# Patient Record
Sex: Male | Born: 1965 | Race: White | Hispanic: No | Marital: Married | Smoking: Never smoker
Health system: Southern US, Community
[De-identification: ages and names within clinical notes are randomized; demographics above are authoritative.]

---

## 2016-12-11 DIAGNOSIS — L439 Lichen planus, unspecified: Secondary | ICD-10-CM | POA: Diagnosis not present

## 2016-12-17 DIAGNOSIS — H5213 Myopia, bilateral: Secondary | ICD-10-CM | POA: Diagnosis not present

## 2016-12-18 DIAGNOSIS — Z0100 Encounter for examination of eyes and vision without abnormal findings: Secondary | ICD-10-CM | POA: Diagnosis not present

## 2016-12-21 DIAGNOSIS — Z0100 Encounter for examination of eyes and vision without abnormal findings: Secondary | ICD-10-CM | POA: Diagnosis not present

## 2017-02-25 DIAGNOSIS — E871 Hypo-osmolality and hyponatremia: Secondary | ICD-10-CM | POA: Insufficient documentation

## 2017-02-25 DIAGNOSIS — I10 Essential (primary) hypertension: Secondary | ICD-10-CM | POA: Diagnosis present

## 2017-02-25 DIAGNOSIS — I160 Hypertensive urgency: Secondary | ICD-10-CM | POA: Insufficient documentation

## 2017-02-26 ENCOUNTER — Emergency Department (HOSPITAL_COMMUNITY)
Admission: EM | Admit: 2017-02-26 | Discharge: 2017-02-26 | Disposition: A | Discharge status: Home / Self Care | Payer: Commercial Managed Care - PPO | Attending: Emergency Medicine | Admitting: Emergency Medicine

## 2017-02-26 ENCOUNTER — Encounter (HOSPITAL_COMMUNITY): Payer: Self-pay

## 2017-02-26 ENCOUNTER — Emergency Department (HOSPITAL_COMMUNITY): Payer: Commercial Managed Care - PPO

## 2017-02-26 DIAGNOSIS — E871 Hypo-osmolality and hyponatremia: Secondary | ICD-10-CM | POA: Diagnosis not present

## 2017-02-26 DIAGNOSIS — I160 Hypertensive urgency: Secondary | ICD-10-CM

## 2017-02-26 DIAGNOSIS — R030 Elevated blood-pressure reading, without diagnosis of hypertension: Secondary | ICD-10-CM | POA: Diagnosis not present

## 2017-02-26 LAB — COMPREHENSIVE METABOLIC PANEL
ALBUMIN: 4.4 g/dL (ref 3.5–5.0)
ALT: 23 U/L (ref 17–63)
ANION GAP: 11 (ref 5–15)
AST: 25 U/L (ref 15–41)
Alkaline Phosphatase: 75 U/L (ref 38–126)
BILIRUBIN TOTAL: 0.6 mg/dL (ref 0.3–1.2)
BUN: 15 mg/dL (ref 6–20)
CHLORIDE: 93 mmol/L — AB (ref 101–111)
CO2: 24 mmol/L (ref 22–32)
Calcium: 9.6 mg/dL (ref 8.9–10.3)
Creatinine, Ser: 0.84 mg/dL (ref 0.61–1.24)
GFR calc Af Amer: >60 mL/min (ref >60)
GFR calc non Af Amer: >60 mL/min (ref >60)
GLUCOSE: 170 mg/dL — AB (ref 65–99)
Potassium: 4.3 mmol/L (ref 3.5–5.1)
SODIUM: 128 mmol/L — AB (ref 135–145)
TOTAL PROTEIN: 6.8 g/dL (ref 6.5–8.1)

## 2017-02-26 LAB — I-STAT CHEM 8, ED
BUN: 18 mg/dL (ref 6–20)
Calcium, Ion: 1.14 mmol/L — ABNORMAL LOW (ref 1.15–1.40)
Chloride: 92 mmol/L — ABNORMAL LOW (ref 101–111)
Creatinine, Ser: 0.7 mg/dL (ref 0.61–1.24)
Glucose, Bld: 164 mg/dL — ABNORMAL HIGH (ref 65–99)
HEMATOCRIT: 46 % (ref 39.0–52.0)
HEMOGLOBIN: 15.6 g/dL (ref 13.0–17.0)
POTASSIUM: 4.2 mmol/L (ref 3.5–5.1)
SODIUM: 129 mmol/L — AB (ref 135–145)
TCO2: 26 mmol/L (ref 0–100)

## 2017-02-26 LAB — I-STAT TROPONIN, ED: TROPONIN I, POC: 0.01 ng/mL (ref 0.00–0.08)

## 2017-02-26 LAB — DIFFERENTIAL
BASOS ABS: 0 10*3/uL (ref 0.0–0.1)
BASOS PCT: 1 %
EOS ABS: 0.2 10*3/uL (ref 0.0–0.7)
EOS PCT: 4 %
LYMPHS ABS: 2.1 10*3/uL (ref 0.7–4.0)
Lymphocytes Relative: 34 %
Monocytes Absolute: 0.6 10*3/uL (ref 0.1–1.0)
Monocytes Relative: 10 %
NEUTROS PCT: 53 %
Neutro Abs: 3.2 10*3/uL (ref 1.7–7.7)

## 2017-02-26 LAB — CBC
HCT: 41.5 % (ref 39.0–52.0)
Hemoglobin: 14.6 g/dL (ref 13.0–17.0)
MCH: 32.8 pg (ref 26.0–34.0)
MCHC: 35.2 g/dL (ref 30.0–36.0)
MCV: 93.3 fL (ref 78.0–100.0)
PLATELETS: 274 10*3/uL (ref 150–400)
RBC: 4.45 MIL/uL (ref 4.22–5.81)
RDW: 13.4 % (ref 11.5–15.5)
WBC: 6.1 10*3/uL (ref 4.0–10.5)

## 2017-02-26 LAB — PROTIME-INR
INR: 1
PROTHROMBIN TIME: 13.2 s (ref 11.4–15.2)

## 2017-02-26 LAB — APTT: APTT: 28 s (ref 24–36)

## 2017-02-26 NOTE — ED Provider Notes (Signed)
[facility]-EMER[facility]ENCY DEPT Provider Note   CSN: [id number] Arrival date & time: 02/25/17  2342     History   Chief Complaint Chief Complaint  Patient presents with  . Hypertension    HPI Cristian Mason is a [age] y.o. male.  HPI  51 year old male presents with hypertension. States that he has a history of hypertension and diabetes. He does not typically take his blood pressure at home. He states that tonight in this evening he started to not feel right. He couldn't describe exactly what the feeling was but he states he just didn't feel good. Later he checked his blood pressure and it was 190 and 180 systolic and consistently with 80s and 90s diastolic. Started googling blood pressure emergencies and became concerned about the possibility of a heart attack and stroke and so he came into the hospital. He states while he was at home he had a 1 or 2 minute episode of very localized numbness to his right thumb and first finger and thenar eminence. Denies any further numbness. He did not note any weakness. At some point he also had bilateral feet tingling but thinks this might of been from the way he was sitting. All of the symptoms have resolved. He never had a headache, chest pain, or shortness of breath.  History reviewed. No pertinent past medical history.  There are no active problems to display for this patient.   History reviewed. No pertinent surgical history.     Home Medications    Prior to Admission medications   Not on File    Family History History reviewed. No pertinent family history.  Social History Social History  Substance Use Topics  . Smoking status: Not on file  . Smokeless tobacco: Not on file  . Alcohol use Not on file     Allergies   Patient has no allergy information on record.   Review of Systems Review of Systems  Eyes: Negative for visual disturbance.  Respiratory: Negative for shortness of breath.   Cardiovascular: Negative for chest pain.    Gastrointestinal: Negative for abdominal pain and vomiting.  Musculoskeletal: Negative for neck pain.  Neurological: Positive for numbness. Negative for dizziness, weakness and headaches.  All other systems reviewed and are negative.    Physical Exam Updated Vital Signs BP (!) 182/100   Pulse 86   Temp 98.8 F (37.1 C) (Oral)   Resp 15   Ht 5\' 10"  (1.778 m)   Wt 90.7 kg (200 lb)   SpO2 100%   BMI 28.70 kg/m   Physical Exam  Constitutional: He is oriented to person, place, and time. He appears well-developed and well-nourished. No distress.  HENT:  Head: Normocephalic and atraumatic.  Right Ear: External ear normal.  Left Ear: External ear normal.  Nose: Nose normal.  Eyes: Right eye exhibits no discharge. Left eye exhibits no discharge.  Neck: Neck supple.  Cardiovascular: Normal rate, regular rhythm and normal heart sounds.   Pulmonary/Chest: Effort normal and breath sounds normal.  Abdominal: Soft. There is no tenderness.  Musculoskeletal: He exhibits no edema.  Neurological: He is alert and oriented to person, place, and time.  CN 3-12 grossly intact. 5/5 strength in all 4 extremities. Grossly normal sensation. Normal finger to nose.   Skin: Skin is warm and dry. He is not diaphoretic.  Nursing note and vitals reviewed.    ED Treatments / Results  Labs (all labs ordered are listed, but only abnormal results are displayed) Labs Reviewed  COMPREHENSIVE  METABOLIC PANEL - Abnormal; Notable for the following:       Result Value   Sodium 128 (*)    Chloride 93 (*)    Glucose, Bld 170 (*)    All other components within normal limits  I-STAT CHEM 8, ED - Abnormal; Notable for the following:    Sodium 129 (*)    Chloride 92 (*)    Glucose, Bld 164 (*)    Calcium, Ion 1.14 (*)    All other components within normal limits  PROTIME-INR  APTT  CBC  DIFFERENTIAL  I-STAT TROPOININ, ED  CBG MONITORING, ED    EKG  EKG Interpretation  Date/Time:  Tuesday February 26 2017 00:17:36 EDT Ventricular Rate:  83 PR Interval:  174 QRS Duration: 84 QT Interval:  360 QTC Calculation: 423 R Axis:   22 Text Interpretation:  Normal sinus rhythm Normal ECG No old tracing to compare Confirmed by Pricilla Loveless [id number]) on 02/26/2017 4:53:40 AM       Radiology Ct Head Wo Contrast  Result Date: 02/26/2017 CLINICAL DATA:  Elevated blood pressure numb feeling to the hand EXAM: CT HEAD WITHOUT CONTRAST TECHNIQUE: Contiguous axial images were obtained from the base of the skull through the vertex without intravenous contrast. COMPARISON:  None. FINDINGS: Brain: No evidence of acute infarction, hemorrhage, hydrocephalus, extra-axial collection or mass lesion/mass effect. Vascular: No hyperdense vessel or unexpected calcification. Skull: Normal. Negative for fracture or focal lesion. Sinuses/Orbits: No acute finding. Mild mucosal thickening in the ethmoid and maxillary sinuses. Small retention cyst in the left maxillary sinus. Non specific left orbital ridge sclerotic focus. Other: None IMPRESSION: No CT evidence for acute intracranial abnormality. Electronically Signed   By: Jasmine Pang M.D.   On: 02/26/2017 00:47    Procedures Procedures (including critical care time)  Medications Ordered in ED Medications - No data to display   Initial Impression / Assessment and Plan / ED Course  I have reviewed the triage vital signs and the nursing notes.  Pertinent labs & imaging results that were available during my care of the patient were reviewed by me and considered in my medical decision making (see chart for details).     Patient is well appearing and has been asymptomatic since arrival to ED. He is still hypertensive. Unclear what his baseline is as he does not check it often. Workup was started in triage with CT head and labs. However my concern for a stroke or TIA is very low. This was very localized numbness to the hand without weakness and bilateral feet tingling. I  think CVA/TIA unlikely. Discussed importance of PCP f/u, he will call in AM. Also discussed mild hyponatremia, may need to be changed off HCTZ. Discussed return precautions.  Final Clinical Impressions(s) / ED Diagnoses   Final diagnoses:  Hypertensive urgency  Hyponatremia    New Prescriptions New Prescriptions   No medications on file     Pricilla Loveless, MD 02/26/17 [phone]

## 2017-02-26 NOTE — ED Notes (Signed)
Pt departed in NAD, refused use of wheelchair.  

## 2017-02-26 NOTE — ED Notes (Signed)
ED Provider at bedside. 

## 2017-02-26 NOTE — ED Triage Notes (Signed)
Pt here for elevated bp, sts hx of htn and dm and sts takes meds but tonight bp was elevated. And reports numb feeling to hand but no weakness noted. Equal grip strengths

## 2017-04-09 DIAGNOSIS — E119 Type 2 diabetes mellitus without complications: Secondary | ICD-10-CM | POA: Diagnosis not present

## 2017-04-09 DIAGNOSIS — Z125 Encounter for screening for malignant neoplasm of prostate: Secondary | ICD-10-CM | POA: Diagnosis not present

## 2017-04-09 DIAGNOSIS — Z0000 Encounter for general adult medical examination without abnormal findings: Secondary | ICD-10-CM | POA: Diagnosis not present

## 2017-04-11 DIAGNOSIS — Z1212 Encounter for screening for malignant neoplasm of rectum: Secondary | ICD-10-CM | POA: Diagnosis not present

## 2017-04-11 DIAGNOSIS — Z0001 Encounter for general adult medical examination with abnormal findings: Secondary | ICD-10-CM | POA: Diagnosis not present

## 2017-04-11 DIAGNOSIS — Z23 Encounter for immunization: Secondary | ICD-10-CM | POA: Diagnosis not present

## 2017-04-11 DIAGNOSIS — I10 Essential (primary) hypertension: Secondary | ICD-10-CM | POA: Diagnosis not present

## 2017-04-25 DIAGNOSIS — Z1211 Encounter for screening for malignant neoplasm of colon: Secondary | ICD-10-CM | POA: Diagnosis not present

## 2017-04-25 DIAGNOSIS — Z1212 Encounter for screening for malignant neoplasm of rectum: Secondary | ICD-10-CM | POA: Diagnosis not present

## 2017-05-10 DIAGNOSIS — I10 Essential (primary) hypertension: Secondary | ICD-10-CM | POA: Diagnosis not present

## 2017-05-10 DIAGNOSIS — E119 Type 2 diabetes mellitus without complications: Secondary | ICD-10-CM | POA: Diagnosis not present

## 2017-06-10 DIAGNOSIS — N400 Enlarged prostate without lower urinary tract symptoms: Secondary | ICD-10-CM | POA: Diagnosis not present

## 2017-06-10 DIAGNOSIS — R972 Elevated prostate specific antigen [PSA]: Secondary | ICD-10-CM | POA: Diagnosis not present

## 2017-06-24 DIAGNOSIS — N400 Enlarged prostate without lower urinary tract symptoms: Secondary | ICD-10-CM | POA: Diagnosis not present

## 2017-08-22 DIAGNOSIS — E119 Type 2 diabetes mellitus without complications: Secondary | ICD-10-CM | POA: Diagnosis not present

## 2017-08-22 DIAGNOSIS — I10 Essential (primary) hypertension: Secondary | ICD-10-CM | POA: Diagnosis not present

## 2017-09-06 DIAGNOSIS — R972 Elevated prostate specific antigen [PSA]: Secondary | ICD-10-CM | POA: Diagnosis not present

## 2017-09-06 DIAGNOSIS — M25462 Effusion, left knee: Secondary | ICD-10-CM | POA: Diagnosis not present

## 2017-09-06 DIAGNOSIS — I10 Essential (primary) hypertension: Secondary | ICD-10-CM | POA: Diagnosis not present

## 2017-09-16 DIAGNOSIS — R972 Elevated prostate specific antigen [PSA]: Secondary | ICD-10-CM | POA: Diagnosis not present

## 2017-10-21 DIAGNOSIS — I10 Essential (primary) hypertension: Secondary | ICD-10-CM | POA: Diagnosis not present

## 2017-10-30 DIAGNOSIS — N411 Chronic prostatitis: Secondary | ICD-10-CM | POA: Diagnosis not present

## 2017-10-30 DIAGNOSIS — N4289 Other specified disorders of prostate: Secondary | ICD-10-CM | POA: Diagnosis not present

## 2017-10-30 DIAGNOSIS — R972 Elevated prostate specific antigen [PSA]: Secondary | ICD-10-CM | POA: Diagnosis not present

## 2017-12-20 DIAGNOSIS — E119 Type 2 diabetes mellitus without complications: Secondary | ICD-10-CM | POA: Diagnosis not present

## 2018-06-11 DIAGNOSIS — Z125 Encounter for screening for malignant neoplasm of prostate: Secondary | ICD-10-CM | POA: Diagnosis not present

## 2018-06-11 DIAGNOSIS — E119 Type 2 diabetes mellitus without complications: Secondary | ICD-10-CM | POA: Diagnosis not present

## 2018-06-11 DIAGNOSIS — Z0000 Encounter for general adult medical examination without abnormal findings: Secondary | ICD-10-CM | POA: Diagnosis not present

## 2018-06-18 DIAGNOSIS — E119 Type 2 diabetes mellitus without complications: Secondary | ICD-10-CM | POA: Diagnosis not present

## 2018-06-18 DIAGNOSIS — Z0000 Encounter for general adult medical examination without abnormal findings: Secondary | ICD-10-CM | POA: Diagnosis not present

## 2018-06-18 DIAGNOSIS — Z7982 Long term (current) use of aspirin: Secondary | ICD-10-CM | POA: Diagnosis not present

## 2018-06-20 ENCOUNTER — Other Ambulatory Visit: Payer: Self-pay | Admitting: Internal Medicine

## 2018-06-20 DIAGNOSIS — E119 Type 2 diabetes mellitus without complications: Secondary | ICD-10-CM

## 2018-06-20 DIAGNOSIS — I10 Essential (primary) hypertension: Secondary | ICD-10-CM

## 2019-06-23 ENCOUNTER — Other Ambulatory Visit: Payer: Self-pay

## 2019-06-23 DIAGNOSIS — Z20822 Contact with and (suspected) exposure to covid-19: Secondary | ICD-10-CM

## 2019-06-25 LAB — NOVEL CORONAVIRUS, NAA: SARS-CoV-2, NAA: NOT DETECTED

## 2019-07-21 ENCOUNTER — Other Ambulatory Visit: Payer: Self-pay

## 2019-07-21 DIAGNOSIS — Z20822 Contact with and (suspected) exposure to covid-19: Secondary | ICD-10-CM

## 2019-07-22 LAB — NOVEL CORONAVIRUS, NAA: SARS-CoV-2, NAA: DETECTED — AB

## 2019-10-20 ENCOUNTER — Ambulatory Visit (INDEPENDENT_AMBULATORY_CARE_PROVIDER_SITE_OTHER): Payer: 59

## 2019-10-20 ENCOUNTER — Other Ambulatory Visit: Payer: Self-pay

## 2019-10-20 ENCOUNTER — Ambulatory Visit: Payer: Commercial Managed Care - PPO | Admitting: Orthopaedic Surgery

## 2019-10-20 ENCOUNTER — Encounter: Payer: Self-pay | Admitting: Orthopaedic Surgery

## 2019-10-20 VITALS — Ht 70.0 in | Wt 190.0 lb

## 2019-10-20 DIAGNOSIS — M25511 Pain in right shoulder: Secondary | ICD-10-CM | POA: Diagnosis not present

## 2019-10-20 DIAGNOSIS — G8929 Other chronic pain: Secondary | ICD-10-CM

## 2019-10-20 NOTE — Progress Notes (Signed)
Office Visit Note   Patient: Cristian Mason           Date of Birth: [birth year]           MRN: [id number] Visit Date: 10/20/2019              Requested by: Merri Brunette, MD [address] [place],  [place] [address] PCP: Merri Brunette, MD   Assessment & Plan: Visit Diagnoses:  1. Chronic right shoulder pain     Plan: Tammy Sours has evidence of impingement involving the right shoulder.  He initially had onset of pain while getting off of a boat at the end of the summer.  He slipped and caught his right arm on a pole.  He had some black and blue discoloration about the elbow where it was "bruised" and slowly that problem resolved.  He was okay to return to playing sports related activities like tennis only to have recurrent pain in the area of his posterior right shoulder.  Now he is having trouble sleeping and reaching that creates pain along the anterior and posterior aspect of the shoulder.  He has a sensation of the shoulder catching and popping.  No numbness or tingling.  I think he could either have a small rotator cuff tear or a labral tear or even some pathology about the biceps tendon insertion.  He is having enough problem and not much relief with over-the-counter medicines I think it is worth obtaining an MR arthrogram  Follow-Up Instructions: Return After MRI arthrogram right shoulder.   Orders:  Orders Placed This Encounter  Procedures  . XR Shoulder Right  . MR SHOULDER RIGHT W CONTRAST   No orders of the defined types were placed in this encounter.     Procedures: No procedures performed   Clinical Data: No additional findings.   Subjective: Chief Complaint  Patient presents with  . Right Shoulder - Pain  Patient presents today for right shoulder pain. He fell getting off his boat at the end of last summer and caught himself on a pole. He is playing tennis and states that his shoulder has been hurting with playing. The pain after his injury  improved, but was initially more painful in his posterior shoulder. With playing tennis the pain is in the same area. No numbness, tingling, or weakness. He is right hand dominant. He takes Advil occasionally. He ices his shoulder after playing tennis. No previous shoulder surgery. He is diabetic.  He also states that it appears like his distal clavicle is sticking out more than it use to.   HPI  Review of Systems   Objective: Vital Signs: Ht 5\' 10"  (1.778 m)   Wt 190 lb (86.2 kg)   BMI 27.26 kg/m   Physical Exam Constitutional:      Appearance: He is well-developed.  Eyes:     Pupils: Pupils are equal, round, and reactive to light.  Pulmonary:     Effort: Pulmonary effort is normal.  Skin:    General: Skin is warm and dry.  Neurological:     Mental Status: He is alert and oriented to person, place, and time.  Psychiatric:        Behavior: Behavior normal.     Ortho Exam right shoulder with positive impingement and empty can testing.  Does have prominence of the distal clavicle with a superiorly projecting osteophyte but no pain.  Some pain along the anterior aspect of the subacromial region.  Good grip and  good release.  Does have good strength but does have some pain.  Some discomfort with the speed sign.  Specialty Comments:  No specialty comments available.  Imaging: XR Shoulder Right  Result Date: 10/20/2019 Films of the right shoulder obtained in several projections.  No acute changes.  Some degenerative change at the chromic clavicular joint with prominence of the distal clavicle superiorly.  Humeral head is centered about the glenoid.  Normal space between the humeral head and the acromion.  No evidence of glenohumeral arthritis    PMFS History: Patient Active Problem List   Diagnosis Date Noted  . Pain in right shoulder 10/20/2019   History reviewed. No pertinent past medical history.  History reviewed. No pertinent family history.  History reviewed. No  pertinent surgical history. Social History   Occupational History  . Not on file  Tobacco Use  . Smoking status: Never Smoker  . Smokeless tobacco: Never Used  Substance and Sexual Activity  . Alcohol use: Yes  . Drug use: Never  . Sexual activity: Not on file

## 2019-10-22 ENCOUNTER — Other Ambulatory Visit: Payer: Self-pay | Admitting: Orthopaedic Surgery

## 2019-10-22 DIAGNOSIS — G8929 Other chronic pain: Secondary | ICD-10-CM

## 2019-11-17 ENCOUNTER — Ambulatory Visit
Admission: RE | Admit: 2019-11-17 | Discharge: 2019-11-17 | Disposition: A | Discharge status: Home / Self Care | Payer: 59 | Source: Ambulatory Visit | Attending: Orthopaedic Surgery | Admitting: Orthopaedic Surgery

## 2019-11-17 DIAGNOSIS — G8929 Other chronic pain: Secondary | ICD-10-CM

## 2019-11-17 MED ORDER — IOPAMIDOL (ISOVUE-M 200) INJECTION 41%
12.0000 mL | Freq: Once | INTRAMUSCULAR | Status: AC
  Administered 2019-11-17: 12 mL via INTRA_ARTICULAR

## 2019-11-19 ENCOUNTER — Encounter: Payer: Self-pay | Admitting: Orthopaedic Surgery

## 2019-11-19 ENCOUNTER — Other Ambulatory Visit: Payer: Self-pay

## 2019-11-19 ENCOUNTER — Ambulatory Visit (INDEPENDENT_AMBULATORY_CARE_PROVIDER_SITE_OTHER): Payer: 59 | Admitting: Orthopaedic Surgery

## 2019-11-19 DIAGNOSIS — S43431A Superior glenoid labrum lesion of right shoulder, initial encounter: Secondary | ICD-10-CM

## 2019-11-19 DIAGNOSIS — M19011 Primary osteoarthritis, right shoulder: Secondary | ICD-10-CM | POA: Diagnosis not present

## 2019-11-19 MED ORDER — LIDOCAINE HCL 1 % IJ SOLN
1.0000 mL | INTRAMUSCULAR | Status: AC | PRN
  Administered 2019-11-19: 1 mL

## 2019-11-19 MED ORDER — METHYLPREDNISOLONE ACETATE 40 MG/ML IJ SUSP
30.0000 mg | INTRAMUSCULAR | Status: AC | PRN
  Administered 2019-11-19: 30 mg via INTRA_ARTICULAR

## 2019-11-19 MED ORDER — BUPIVACAINE HCL 0.25 % IJ SOLN
0.6600 mL | INTRAMUSCULAR | Status: AC | PRN
  Administered 2019-11-19: .66 mL via INTRA_ARTICULAR

## 2019-11-19 NOTE — Progress Notes (Signed)
Office Visit Note   Patient: Cristian Mason           Date of Birth: 02-Jan-1966           MRN: [id number] Visit Date: 11/19/2019              Requested by: Merri Brunette, MD [address] [place],  [place] [address] PCP: Merri Brunette, MD   Assessment & Plan: Visit Diagnoses:  1. Labral tear of shoulder, right, initial encounter   2. Primary osteoarthritis, right shoulder   3. Osteoarthritis of right AC (acromioclavicular) joint     Plan:  #1: Corticosteroid injection was placed intra-articularly from an anterior approach by Dr. Cleophas Dunker.  Tolerated the procedure well #2: We will see how this does and what type of effective will have. #3: Dr. Cleophas Dunker had a discussion with him also stating that he had spoken to Dr.Dean who felt that because a labral tear it may not be beneficial to repair it   Follow-Up Instructions: No follow-ups on file.   Face-to-face time spent with patient was greater than 30 minutes.  Greater than 50% of the time was spent in counseling and coordination of care.  Orders:  Orders Placed This Encounter  Procedures  . Large Joint Inj   No orders of the defined types were placed in this encounter.     Procedures: Large Joint Inj: R glenohumeral on 11/19/2019 2:59 PM Indications: pain and diagnostic evaluation Details: 25 G 1.5 in needle, anterior approach  Arthrogram: No  Medications: 1 mL lidocaine 1 %; 0.66 mL bupivacaine 0.25 %; 30 mg methylPREDNISolone acetate 40 MG/ML Outcome: tolerated well, no immediate complications  Injection performed by Dr Norlene Campbell Consent was given by the patient. Immediately prior to procedure a time out was called to verify the correct patient, procedure, equipment, support staff and site/side marked as required. Patient was prepped and draped in the usual sterile fashion.       Clinical Data: No additional findings.   Subjective: Chief Complaint  Patient presents with  . Right  Shoulder - Follow-up    MRI results  Patient presents today for follow up on his right shoulder. He had an MRI done on 11/17/2019 and is here today to go over those results.  HPI  Cristian Mason is seen today for review of his MRI scan which was previously performed.  He did have a problem with is right shoulder which developed after he was getting out of a boat at the end of summer when he slipped and caught his right arm on a pole.  He did have some ecchymosis about the elbow where it was bruised.  This has slowly resolved.  He returned to playing sports such as tennis only to have recurrent pain in the area of the posterior right shoulder.  He was seen previously and an MRI scan was ordered he returns today for review.   Review of Systems  Endocrine:       Diabetes   All other systems reviewed and are negative.    Objective: Vital Signs: Ht 5\' 10"  (1.778 m)   Wt 190 lb (86.2 kg)   BMI 27.26 kg/m   Physical Exam Constitutional:      Appearance: He is well-developed.  Eyes:     Pupils: Pupils are equal, round, and reactive to light.  Pulmonary:     Effort: Pulmonary effort is normal.  Skin:    General: Skin is warm and dry.  Neurological:  Mental Status: He is alert and oriented to person, place, and time.  Psychiatric:        Behavior: Behavior normal.     Ortho Exam  Exam is essentially unchanged from previous visit.  Specialty Comments:  No specialty comments available.  Imaging: MR SHOULDER RIGHT W CONTRAST  Result Date: 11/18/2019 CLINICAL DATA:  Right shoulder pain, popping, weakness and limited range of motion since a fall in June, 2020. EXAM: MR ARTHROGRAM OF THE RIGHT SHOULDER TECHNIQUE: Multiplanar, multisequence MR imaging of the right shoulder was performed following the administration of intra-articular contrast. CONTRAST:  See Injection Documentation. COMPARISON:  Plain films right shoulder from [facility] 10/20/2019. Image from contrast injection today.  FINDINGS: Rotator cuff: Intact. Mild appearing supraspinatus and infraspinatus tendinopathy noted. Muscles: Normal without atrophy or focal lesion. Biceps long head: Intact with normal signal. The biceps attachment to the superior labrum is normal. Acromioclavicular Joint: Moderate to moderately severe osteoarthritis is present. The acromion is type 1. There is no contrast or fluid in the subacromial/subdeltoid bursa. Glenohumeral Joint: The patient has glenohumeral degenerative change with cartilage thinning and an osteophyte off the humeral head. Small subchondral cysts are seen in the posterior, inferior glenoid. Labrum: There is a near circumferential tear of the labrum. Only the anterior, superior labrum appears intact. Bones: No fracture or worrisome lesion. IMPRESSION: Dominant finding is glenohumeral osteoarthritis with an associated near circumferential tear of the labrum. Mild appearing supraspinatus and infraspinatus tendinopathy without tear. Moderate to moderately severe acromioclavicular osteoarthritis. Electronically Signed   By: Inge Rise M.D.   On: 11/18/2019 09:17     PMFS History: Current Outpatient Medications  Medication Sig Dispense Refill  . amLODipine (NORVASC) 10 MG tablet TK 1 T PO QD    . aspirin EC 81 MG tablet Take 81 mg by mouth daily.    Marland Kitchen BYSTOLIC 20 MG TABS Take 1 tablet by mouth daily.    Marland Kitchen GLYXAMBI 25-5 MG TABS TK 1 T PO QD    . loratadine (CLARITIN) 10 MG tablet Take 10 mg by mouth daily.    . metFORMIN (GLUCOPHAGE-XR) 750 MG 24 hr tablet TK 2 TS PO QD WITH THE EVE MEAL    . MULTIPLE VITAMIN PO Take by mouth.    . TRESIBA FLEXTOUCH 100 UNIT/ML SOPN FlexTouch Pen INJECT 16 UNITS SUBCUTANEOUSLY ONCE A DAY     No current facility-administered medications for this visit.    Patient Active Problem List   Diagnosis Date Noted  . Labral tear of shoulder, right, initial encounter 11/19/2019  . Primary osteoarthritis, right shoulder 11/19/2019  .  Osteoarthritis of right AC (acromioclavicular) joint 11/19/2019  . Pain in right shoulder 10/20/2019   History reviewed. No pertinent past medical history.  History reviewed. No pertinent family history.  History reviewed. No pertinent surgical history. Social History   Occupational History  . Not on file  Tobacco Use  . Smoking status: Never Smoker  . Smokeless tobacco: Never Used  Substance and Sexual Activity  . Alcohol use: Yes  . Drug use: Never  . Sexual activity: Not on file

## 2019-11-20 ENCOUNTER — Ambulatory Visit: Payer: 59

## 2019-11-26 ENCOUNTER — Ambulatory Visit: Payer: 59 | Attending: Internal Medicine

## 2019-11-26 DIAGNOSIS — Z23 Encounter for immunization: Secondary | ICD-10-CM

## 2019-11-26 NOTE — Progress Notes (Signed)
   Covid-19 Vaccination Clinic  Name:  KAELUM KISSICK    MRN: [id number] DOB: 02-26-1966  11/26/2019  Mr. Vert was observed post Covid-19 immunization for 15 minutes without incident. He was provided with Vaccine Information Sheet and instruction to access the V-Safe system.   Mr. Slovacek was instructed to call 911 with any severe reactions post vaccine: Marland Kitchen Difficulty breathing  . Swelling of face and throat  . A fast heartbeat  . A bad rash all over body  . Dizziness and weakness   Immunizations Administered    Name Date Dose VIS Date Route   Pfizer COVID-19 Vaccine 11/26/2019 10:21 AM 0.3 mL 08/28/2019 Intramuscular   Manufacturer: [employer], [employer]   Lot: [id number]   NDC: [address]-4

## 2019-12-21 ENCOUNTER — Ambulatory Visit: Payer: 59 | Attending: Internal Medicine

## 2019-12-21 DIAGNOSIS — Z23 Encounter for immunization: Secondary | ICD-10-CM

## 2019-12-21 NOTE — Progress Notes (Signed)
   Covid-19 Vaccination Clinic  Name:  AARIN BLUETT    MRN: [id number] DOB: 1966/04/20  12/21/2019  Mr. Mose was observed post Covid-19 immunization for 15 minutes without incident. He was provided with Vaccine Information Sheet and instruction to access the V-Safe system.   Mr. Biello was instructed to call 911 with any severe reactions post vaccine: Marland Kitchen Difficulty breathing  . Swelling of face and throat  . A fast heartbeat  . A bad rash all over body  . Dizziness and weakness   Immunizations Administered    Name Date Dose VIS Date Route   Pfizer COVID-19 Vaccine 12/21/2019 10:20 AM 0.3 mL 08/28/2019 Intramuscular   Manufacturer: [employer], [employer]   Lot: [id number]   NDC: [address]-9

## 2020-06-29 IMAGING — MR MR SHOULDER*R* W/CM
7 series · 40 of 40 positions shown · IV contrast (agent unspecified)
Comparison: Plain films right shoulder from [REDACTED]
10/20/2019. Image from contrast injection today.

CLINICAL DATA: Right shoulder pain, popping, weakness and limited
range of motion since a fall in February 2019.

EXAM:
MR ARTHROGRAM OF THE RIGHT SHOULDER
TECHNIQUE: Multiplanar, multisequence MR imaging of the right shoulder was
performed following the administration of intra-articular contrast.
CONTRAST:  See Injection Documentation.

[Series 3: T1 fat-sat · axial · 4.0mm · 0.27mm/px · z∈[-45,+70]mm · 6 of 24 slices shown (1 of 4)]
[im 1/24]
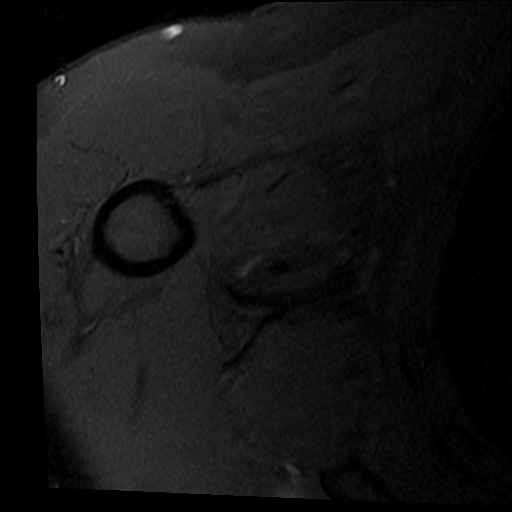
[im 5/24]
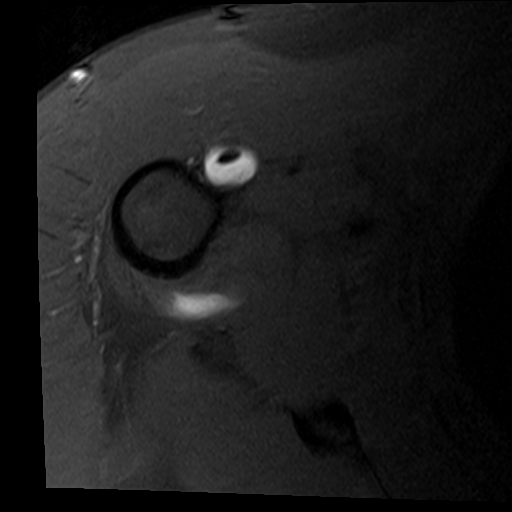
[im 10/24]
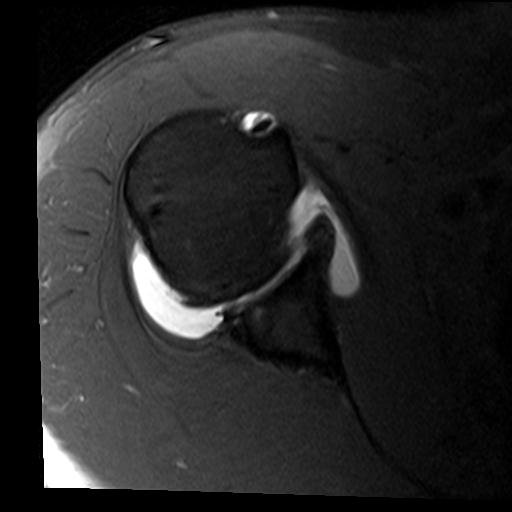
[im 14/24]
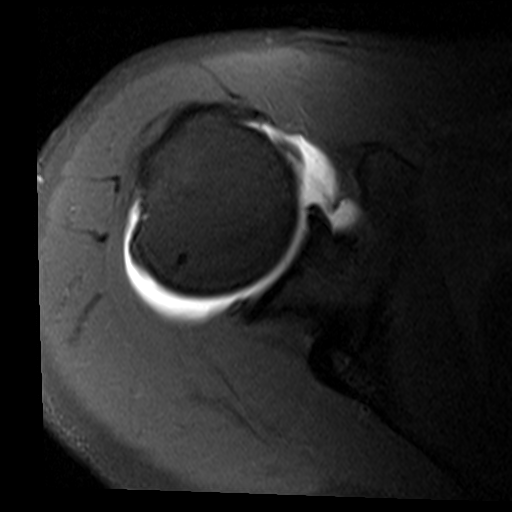
[im 19/24]
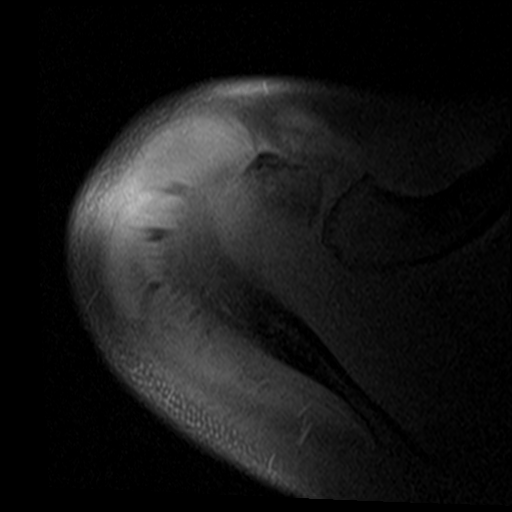
[im 24/24]
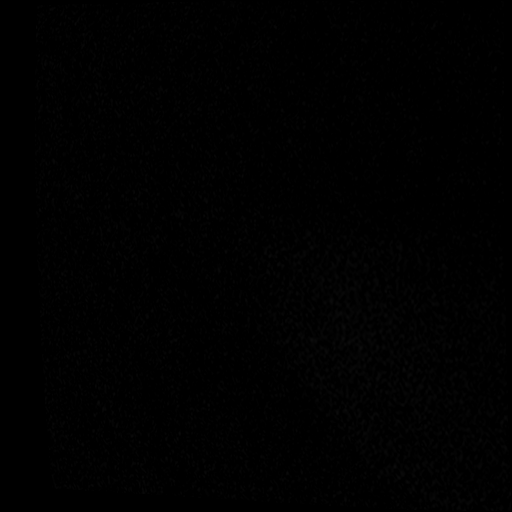

[Series 4: T2 fat-sat · coronal · 4.0mm · 0.55mm/px · 6 of 23 slices shown (1 of 3)]
[im 1/23]
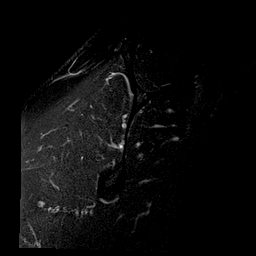
[im 5/23]
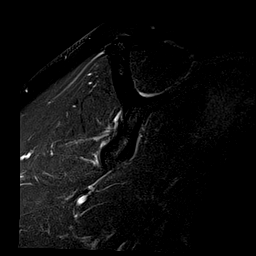
[im 9/23]
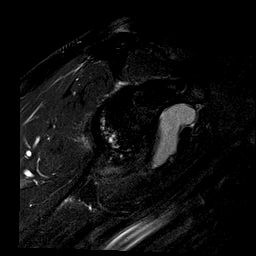
[im 14/23]
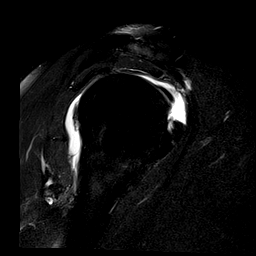
[im 18/23]
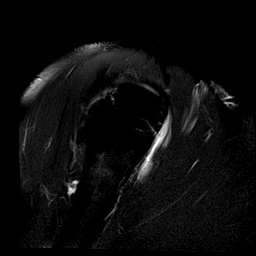
[im 23/23]
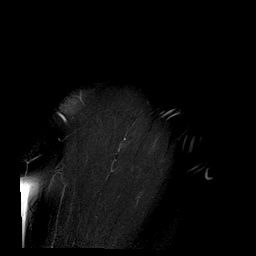

[Series 6: T2 fat-sat · coronal · 4.0mm · 0.55mm/px · 6 of 23 slices shown (2 of 3)]
[im 1/23]
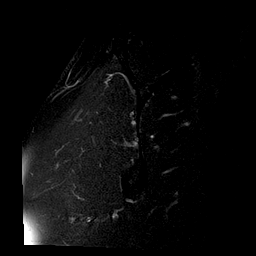
[im 5/23]
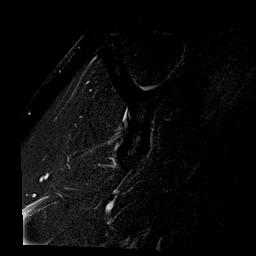
[im 9/23]
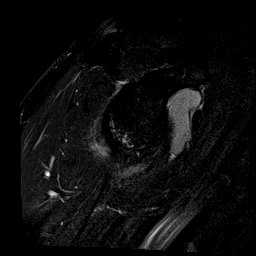
[im 14/23]
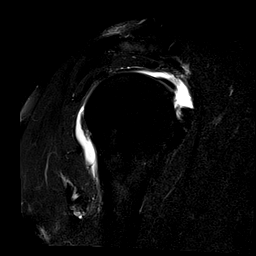
[im 18/23]
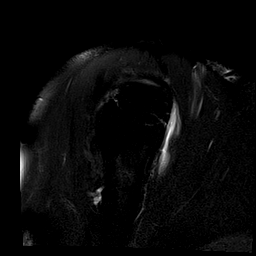
[im 23/23]
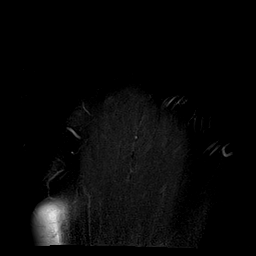

[Series 7: T1 fat-sat · sagittal · 4.0mm · 0.55mm/px · 6 of 22 slices shown (2 of 4)]
[im 1/22]
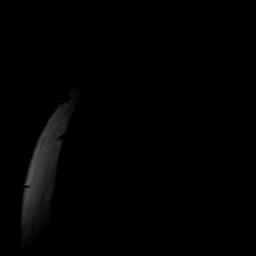
[im 5/22]
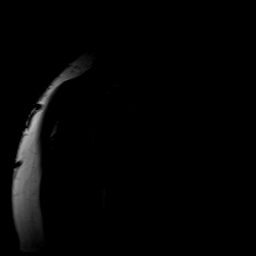
[im 9/22]
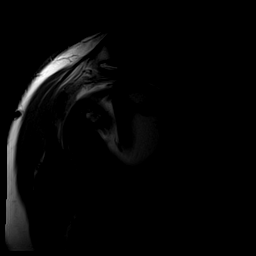
[im 13/22]
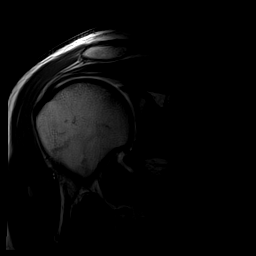
[im 17/22]
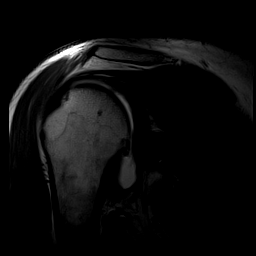
[im 22/22]
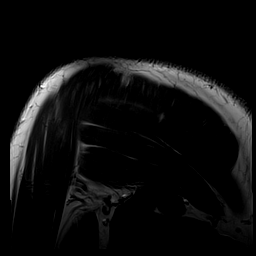

[Series 8: T1 fat-sat · sagittal · 4.0mm · 0.55mm/px · 6 of 22 slices shown (3 of 4)]
[im 1/22]
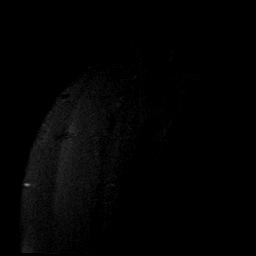
[im 5/22]
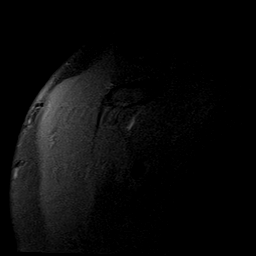
[im 9/22]
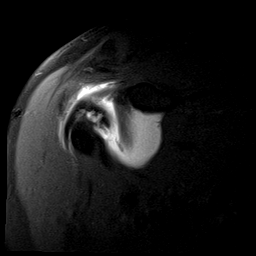
[im 13/22]
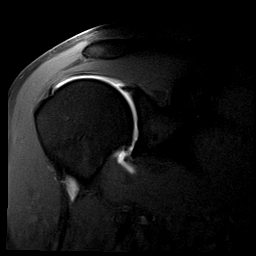
[im 17/22]
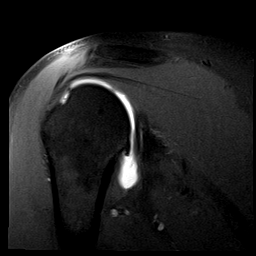
[im 22/22]
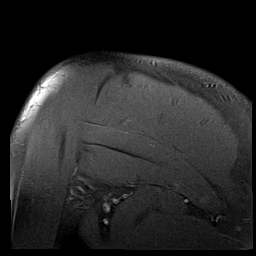

[Series 9: T2 fat-sat · sagittal · 4.0mm · 0.55mm/px · 6 of 22 slices shown (3 of 3)]
[im 1/22]
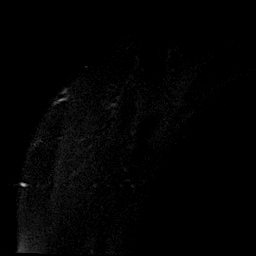
[im 5/22]
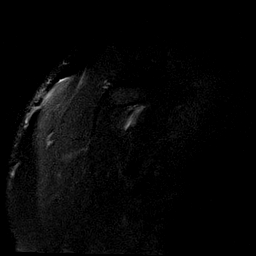
[im 9/22]
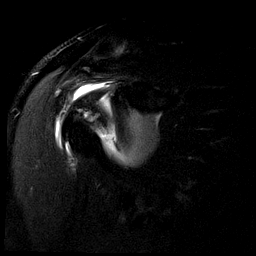
[im 13/22]
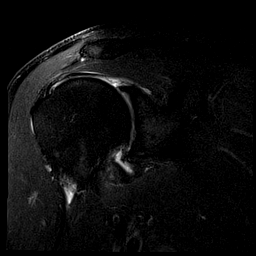
[im 17/22]
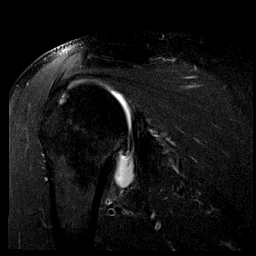
[im 22/22]
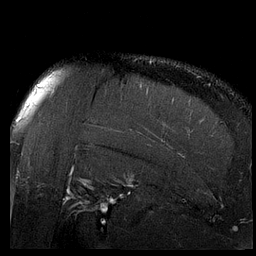

[Series 15: T1 fat-sat · sagittal · 4.0mm · 0.59mm/px · 4 of 16 slices shown (4 of 4)]
[im 1/16]
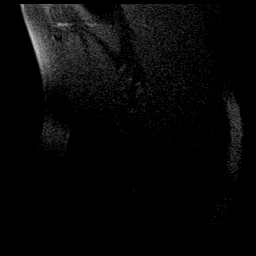
[im 6/16]
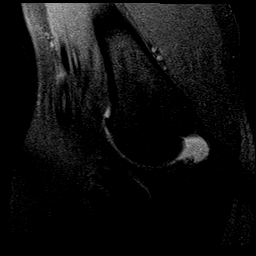
[im 11/16]
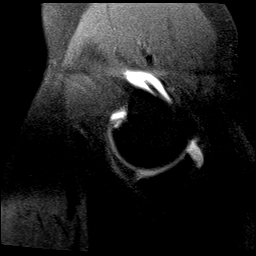
[im 16/16]
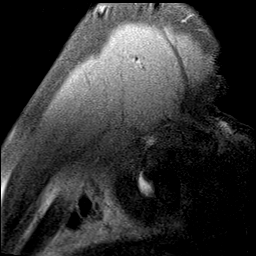

[40 of 40 positions shown; findings below may reference images not displayed]

FINDINGS: Rotator cuff: Intact. Mild appearing supraspinatus and infraspinatus
tendinopathy noted.

Muscles: Normal without atrophy or focal lesion.

Biceps long head: Intact with normal signal. The biceps attachment
to the superior labrum is normal.

Acromioclavicular Joint: Moderate to moderately severe
osteoarthritis is present. The acromion is type 1. There is no
contrast or fluid in the subacromial/subdeltoid bursa.

Glenohumeral Joint: The patient has glenohumeral degenerative change
with cartilage thinning and an osteophyte off the humeral head.
Small subchondral cysts are seen in the posterior, inferior glenoid.

Labrum: There is a near circumferential tear of the labrum. Only the
anterior, superior labrum appears intact.

Bones: No fracture or worrisome lesion.
IMPRESSION: Dominant finding is glenohumeral osteoarthritis with an associated
near circumferential tear of the labrum.

Mild appearing supraspinatus and infraspinatus tendinopathy without
tear.

Moderate to moderately severe acromioclavicular osteoarthritis.

## 2020-10-28 ENCOUNTER — Other Ambulatory Visit: Payer: Self-pay | Admitting: Internal Medicine

## 2020-10-28 DIAGNOSIS — E7800 Pure hypercholesterolemia, unspecified: Secondary | ICD-10-CM

## 2020-11-25 ENCOUNTER — Ambulatory Visit
Admission: RE | Admit: 2020-11-25 | Discharge: 2020-11-25 | Disposition: A | Discharge status: Home / Self Care | Payer: No Typology Code available for payment source | Source: Ambulatory Visit | Attending: Internal Medicine | Admitting: Internal Medicine

## 2020-11-25 DIAGNOSIS — E7800 Pure hypercholesterolemia, unspecified: Secondary | ICD-10-CM

## 2023-11-13 ENCOUNTER — Ambulatory Visit: Payer: 59 | Admitting: Behavioral Health

## 2023-11-13 DIAGNOSIS — F4323 Adjustment disorder with mixed anxiety and depressed mood: Secondary | ICD-10-CM | POA: Diagnosis not present

## 2023-11-26 ENCOUNTER — Ambulatory Visit: Payer: 59 | Admitting: Behavioral Health

## 2023-11-26 DIAGNOSIS — F411 Generalized anxiety disorder: Secondary | ICD-10-CM | POA: Diagnosis not present

## 2023-11-26 DIAGNOSIS — Z630 Problems in relationship with spouse or partner: Secondary | ICD-10-CM | POA: Diagnosis not present

## 2023-11-26 NOTE — Progress Notes (Addendum)
 [facility] Counselor/Therapist Progress Note  Patient ID: MOMEN HAM, MRN: [id number],    Date: 11/26/2023  Time Spent: 55 min In Person @ [facility]  Treatment Type:  Cpl Th  Reported Symptoms: Cont'd marital conflict w/some reduction today in the stress due to Wife Caregiving for Fr in the [place] setting. Fr has been d/c'd.  Mental Status Exam: Appearance:  Casual     Behavior: Appropriate & Sharing  Motor: Normal  Speech/Language:  Clear and Coherent  Affect: Appropriate  Mood: normal  Thought process: normal  Thought content:   WNL  Sensory/Perceptual disturbances:   WNL  Orientation: oriented to person, place, time/date, and situation  Attention: Good  Concentration: Good  Memory: WNL  Fund of knowledge:  Good  Insight:   Good  Judgment:  Good  Impulse Control: Good   Risk Assessment: Danger to Self:  No Self-injurious Behavior: No Danger to Others: No Duty to Warn:no Physical Aggression / Violence:No  Access to Firearms a concern: No  Gang Involvement:No   Subjective: Cpl are calmer today & cont to communicate w/o intentional grace. Toniann Fail is sensitive towards Greg's responses to her & Tammy Sours does not understand her sensitivity. They seek to find time alone tgthr & find it a struggle btwn the Caregiving duties Toniann Fail is handling. Her Fr was d/c'd from the [facility] & he is now home with 24/7 Care. Toniann Fail is trying to negotiate w/the GF of Dad often.   Interventions:  Cpl Th using elements of Gottman & EFT   Diagnosis:Marital problem  Anxiety state  Plan: For our next session, Toniann Fail & Tammy Sours will share the travel plans they have arranged.  Target Date: 12/31/2023  Progress: 5  Frequency: Once every 2-3 wks  Modality: Cpl Th  Sent Cpl the short article, 'Your No-Guilt Guide' (the 4 golden rules for taking a break-stress free).   Deneise Lever, LMFT

## 2023-11-26 NOTE — Progress Notes (Signed)
   Deneise Lever, LMFT

## 2024-01-14 ENCOUNTER — Ambulatory Visit: Admitting: Behavioral Health

## 2024-01-14 DIAGNOSIS — F411 Generalized anxiety disorder: Secondary | ICD-10-CM | POA: Diagnosis not present

## 2024-01-14 DIAGNOSIS — Z630 Problems in relationship with spouse or partner: Secondary | ICD-10-CM

## 2024-01-14 NOTE — Progress Notes (Addendum)
 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 Pt is aware of the risks/limitations of telehealth & consents to Tx today.  Time In: 11:00am Time Out: 11:45am  Treatment Type: Individual Therapy  Reported Symptoms: Elevated anx/dep & stress due to Cpl dynamics in the past few wks  Mental Status Exam: Appearance:  Casual     Behavior: Appropriate and Sharing  Motor: Normal  Speech/Language:  Clear and Coherent  Affect: Appropriate  Mood: anxious  Thought process: normal  Thought content:   WNL  Sensory/Perceptual disturbances:   WNL  Orientation: oriented to person, place, time/date, and situation  Attention: Good  Concentration: Good  Memory: WNL  Fund of knowledge:  Good  Insight:   Good  Judgment:  Good  Impulse Control: Good   Risk Assessment: Danger to Self:  No Self-injurious Behavior: No Danger to Others: No Duty to Warn:no Physical Aggression / Violence:No  Access to Firearms a concern: No  Gang Involvement:No   Subjective: Pt is upset over Wife's beh & rxns to him in the last few wks. She has been including her Son Cristian Mason in the Family dynamics btwn the Cpl.    Emphasized to Pt the need to respect their son Cristian Mason & not include him on Cpl spats. He can decline to demands Mom or Dad make, & that is ok, he is 58yo. Pt will try to consider the discretion of his Wife's Care Team & the problems this can cause for his Wife.   Interventions: Family Systems  Diagnosis:Anxiety state  Marital problem  Plan: Cristian Mason will cont to monitor his consumption of ETOH & try to keep the suggestions in mind from our session today. We will meet indiv'ly, Cristian Mason & I @ our next session & convene again as a Cpl session afterwards.   Target Date: 02/14/2024  Progress:  6  Frequency: Once every 2-3 wks  Modality: Cristian Fam, LMFT

## 2024-01-14 NOTE — Progress Notes (Signed)
   Deneise Lever, LMFT

## 2024-01-20 NOTE — Progress Notes (Unsigned)
 [facility] Counselor/Therapist Progress Note  Patient ID: Cristian Mason, MRN: [id number],    Date: 11/13/2023  Time Spent: 50 min In Person @ [facility]   Treatment Type: Individual Therapy  Reported Symptoms: Elevated anx/dep & stress due to marital conflict; Pt presents alone today as his Wife Cristian Mason had to take her Fr to a Dr. Visit.   Mental Status Exam: Appearance:  Casual     Behavior: Appropriate and Sharing  Motor: Normal  Speech/Language:  Clear and Coherent  Affect: Appropriate  Mood: anxious  Thought process: normal  Thought content:   WNL  Sensory/Perceptual disturbances:   WNL  Orientation: oriented to person, place, time/date, and situation  Attention: Good  Concentration: Good  Memory: WNL  Fund of knowledge:  Good  Insight:   Fair  Judgment:  Good  Impulse Control: Fair   Risk Assessment: Danger to Self:  No Self-injurious Behavior: No Danger to Others: No Duty to Warn:no Physical Aggression / Violence:No  Access to Firearms a concern: No  Gang Involvement:No   Subjective: Pt reports Wife has secured a new job & likes her Cristian Mason. This is different for her bc ususally he sts, "she needs to be in charge & this causes her to leave jobs".   Pt is engaged in his month-long Feb tradition of having no ETOH. He did this until 11/04/2023 when he met friends & consumed 4 beers; up until that time he was doing well w/Abstinence. He feels he cannot "win for losing" w/his Wife re: ETOH consumption. She is a black & white thinker so it is all or none.   Pt noted his Wife's F Hx of mental health issues & thinks she may also be impacted by mental issues. She has high anxiety like her Str & Parents.   Interventions: Insight-Oriented  Diagnosis:Adjustment disorder with mixed anxiety and depressed mood  Plan: Encouraged Pt in his efforts to abstain & moderate his use of ETOH. It has become the number one issue in their marriage & he is frustrated  by her lack of understanding. Supported Pt in his movement towards improved health. Inquired about outcome eval & future sessions. Pt is encouraged to cont w/Cpl Th & will consider his Wife's point of view in the situation as discussed today so he can inc his empathy & understanding.   Target Date: 12/15/2023  Progress: 6  Frequency: Once every 2-3 wks  Modality: Deborrah Fam, LMFT

## 2024-01-20 NOTE — Progress Notes (Unsigned)
   Deneise Lever, LMFT
